# Patient Record
Sex: Male | Born: 1990 | Race: White | Hispanic: No | Marital: Single | Smoking: Current every day smoker
Health system: Southern US, Community
[De-identification: ages and names within clinical notes are randomized; demographics above are authoritative.]

## PROBLEM LIST (undated history)

## (undated) DIAGNOSIS — S6291XA Unspecified fracture of right wrist and hand, initial encounter for closed fracture: Secondary | ICD-10-CM

## (undated) DIAGNOSIS — F329 Major depressive disorder, single episode, unspecified: Secondary | ICD-10-CM

## (undated) DIAGNOSIS — F419 Anxiety disorder, unspecified: Secondary | ICD-10-CM

## (undated) DIAGNOSIS — F32A Depression, unspecified: Secondary | ICD-10-CM

## (undated) HISTORY — PX: KNEE SURGERY: SHX244

## (undated) HISTORY — PX: FOREIGN BODY REMOVAL: SHX962

## (undated) HISTORY — PX: TONSILLECTOMY: SUR1361

---
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
## 2012-11-24 ENCOUNTER — Encounter (HOSPITAL_COMMUNITY): Payer: Self-pay | Admitting: *Deleted

## 2012-11-24 ENCOUNTER — Emergency Department (INDEPENDENT_AMBULATORY_CARE_PROVIDER_SITE_OTHER): Payer: 59

## 2012-11-24 ENCOUNTER — Emergency Department (HOSPITAL_COMMUNITY)
Admission: EM | Admit: 2012-11-24 | Discharge: 2012-11-24 | Disposition: A | Discharge status: Home / Self Care | Payer: 59 | Source: Home / Self Care | Attending: Family Medicine | Admitting: Family Medicine

## 2012-11-24 DIAGNOSIS — S63509A Unspecified sprain of unspecified wrist, initial encounter: Secondary | ICD-10-CM

## 2012-11-24 DIAGNOSIS — S60221A Contusion of right hand, initial encounter: Secondary | ICD-10-CM

## 2012-11-24 DIAGNOSIS — S60229A Contusion of unspecified hand, initial encounter: Secondary | ICD-10-CM

## 2012-11-24 DIAGNOSIS — S63501A Unspecified sprain of right wrist, initial encounter: Secondary | ICD-10-CM

## 2012-11-24 HISTORY — DX: Unspecified fracture of right wrist and hand, initial encounter for closed fracture: S62.91XA

## 2012-11-24 MED ORDER — IBUPROFEN 600 MG PO TABS: 600.0000 mg | ORAL_TABLET | Freq: Three times a day (TID) | ORAL | Status: DC

## 2012-11-24 MED ORDER — TRAMADOL HCL 50 MG PO TABS: 50.0000 mg | ORAL_TABLET | Freq: Four times a day (QID) | ORAL | Status: DC | PRN

## 2012-11-24 MED ORDER — HYDROCODONE-ACETAMINOPHEN 5-500 MG PO TABS: 1.0000 | ORAL_TABLET | Freq: Three times a day (TID) | ORAL | Status: DC | PRN

## 2012-11-24 NOTE — ED Notes (Signed)
Ice bag applied right hand/wrist.
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
## 2014-02-02 ENCOUNTER — Encounter (HOSPITAL_COMMUNITY): Payer: Self-pay | Admitting: Emergency Medicine

## 2014-02-02 ENCOUNTER — Emergency Department (HOSPITAL_COMMUNITY)
Admission: EM | Admit: 2014-02-02 | Discharge: 2014-02-02 | Disposition: A | Discharge status: Home / Self Care | Payer: Managed Care, Other (non HMO) | Attending: Emergency Medicine | Admitting: Emergency Medicine

## 2014-02-02 DIAGNOSIS — F39 Unspecified mood [affective] disorder: Secondary | ICD-10-CM | POA: Insufficient documentation

## 2014-02-02 DIAGNOSIS — F1120 Opioid dependence, uncomplicated: Secondary | ICD-10-CM

## 2014-02-02 DIAGNOSIS — R000 Tachycardia, unspecified: Secondary | ICD-10-CM | POA: Insufficient documentation

## 2014-02-02 DIAGNOSIS — R630 Anorexia: Secondary | ICD-10-CM | POA: Insufficient documentation

## 2014-02-02 DIAGNOSIS — R110 Nausea: Secondary | ICD-10-CM | POA: Insufficient documentation

## 2014-02-02 DIAGNOSIS — F17200 Nicotine dependence, unspecified, uncomplicated: Secondary | ICD-10-CM | POA: Insufficient documentation

## 2014-02-02 DIAGNOSIS — R61 Generalized hyperhidrosis: Secondary | ICD-10-CM | POA: Insufficient documentation

## 2014-02-02 DIAGNOSIS — Z8781 Personal history of (healed) traumatic fracture: Secondary | ICD-10-CM | POA: Insufficient documentation

## 2014-02-02 DIAGNOSIS — R6883 Chills (without fever): Secondary | ICD-10-CM | POA: Insufficient documentation

## 2014-02-02 DIAGNOSIS — F1010 Alcohol abuse, uncomplicated: Secondary | ICD-10-CM | POA: Insufficient documentation

## 2014-02-02 HISTORY — DX: Anxiety disorder, unspecified: F41.9

## 2014-02-02 HISTORY — DX: Depression, unspecified: F32.A

## 2014-02-02 HISTORY — DX: Major depressive disorder, single episode, unspecified: F32.9

## 2014-02-02 LAB — COMPREHENSIVE METABOLIC PANEL
ALK PHOS: 90 U/L (ref 39–117)
ALT: 12 U/L (ref 0–53)
AST: 17 U/L (ref 0–37)
Albumin: 4.8 g/dL (ref 3.5–5.2)
BILIRUBIN TOTAL: 1 mg/dL (ref 0.3–1.2)
BUN: 7 mg/dL (ref 6–23)
CHLORIDE: 102 meq/L (ref 96–112)
CO2: 29 meq/L (ref 19–32)
CREATININE: 0.99 mg/dL (ref 0.50–1.35)
Calcium: 10.3 mg/dL (ref 8.4–10.5)
GFR calc Af Amer: >90 mL/min (ref >90)
GLUCOSE: 121 mg/dL — AB (ref 70–99)
POTASSIUM: 4.2 meq/L (ref 3.7–5.3)
Sodium: 143 mEq/L (ref 137–147)
Total Protein: 7.6 g/dL (ref 6.0–8.3)

## 2014-02-02 LAB — CBC WITH DIFFERENTIAL/PLATELET
Basophils Absolute: 0 10*3/uL (ref 0.0–0.1)
Basophils Relative: 0 % (ref 0–1)
Eosinophils Absolute: 0 10*3/uL (ref 0.0–0.7)
Eosinophils Relative: 1 % (ref 0–5)
HEMATOCRIT: 45.5 % (ref 39.0–52.0)
HEMOGLOBIN: 16.8 g/dL (ref 13.0–17.0)
LYMPHS ABS: 1.4 10*3/uL (ref 0.7–4.0)
LYMPHS PCT: 23 % (ref 12–46)
MCH: 33 pg (ref 26.0–34.0)
MCHC: 36.9 g/dL — AB (ref 30.0–36.0)
MCV: 89.4 fL (ref 78.0–100.0)
MONO ABS: 0.4 10*3/uL (ref 0.1–1.0)
MONOS PCT: 6 % (ref 3–12)
NEUTROS ABS: 4.5 10*3/uL (ref 1.7–7.7)
Neutrophils Relative %: 70 % (ref 43–77)
Platelets: 204 10*3/uL (ref 150–400)
RBC: 5.09 MIL/uL (ref 4.22–5.81)
RDW: 11.9 % (ref 11.5–15.5)
WBC: 6.3 10*3/uL (ref 4.0–10.5)

## 2014-02-02 LAB — SALICYLATE LEVEL

## 2014-02-02 LAB — RAPID URINE DRUG SCREEN, HOSP PERFORMED
Amphetamines: NOT DETECTED
Barbiturates: NOT DETECTED
Benzodiazepines: NOT DETECTED
Cocaine: POSITIVE — AB
Opiates: POSITIVE — AB
Tetrahydrocannabinol: NOT DETECTED

## 2014-02-02 LAB — ACETAMINOPHEN LEVEL

## 2014-02-02 LAB — ETHANOL

## 2014-02-02 NOTE — Discharge Instructions (Signed)
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 Here are some important things to consider:  There are different types of treatment to choose from.  Some programs are live-in (residential) while others are not (outpatient). Sometimes a combination is offered.  No single type of program is right for everyone.  Most treatment programs involve a combination of education, counseling, and a 12-step, spiritually-based approach.  There are non-spiritually based programs (not 12-step).  Some treatment programs are government sponsored. They are geared for patients without private insurance.  Treatment programs can vary in many respects such as:  Cost and types of insurance accepted.  Types of on-site medical services offered.  Length of stay, setting, and size.  Overall philosophy of treatment. A person may need specialized treatment or have needs not addressed by all programs. For example, adolescents need treatment appropriate for their age. Other people have secondary disorders that must be managed as well. Secondary conditions can include mental illness, such as depression or diabetes. Often, a period of detoxification from alcohol or drugs is needed. This requires medical supervision and not all programs offer this. THINGS TO CONSIDER WHEN SELECTING A TREATMENT PROGRAM   Is the program certified by the appropriate government agency? Even private programs must be certified and employ certified professionals.  Does the program accept  your insurance? If not, can a payment plan be set up?  Is the facility clean, organized, and well run? Do they allow you to speak with graduates who can share their treatment experience with you? Can you tour the facility? Can you meet with staff?  Does the program meet the full range of individual needs?  Does the treatment program address sexual orientation and physical disabilities? Do they provide age, gender, and culturally appropriate treatment services?  Is treatment available in languages other than English?  Is long-term aftercare support or guidance encouraged and provided?  Is assessment of an individual's treatment plan ongoing to ensure it meets changing needs?  Does the program use strategies to encourage reluctant patients to remain in treatment long enough to increase the likelihood of success?  Does the program offer counseling (individual or group) and other behavioral therapies?  Does the program offer medicine as part of the treatment regimen, if needed?  Is there ongoing monitoring of possible relapse? Is there a defined relapse prevention program? Are services or referrals offered to family members to ensure they understand addiction and the recovery process? This would help them support the recovering individual.  Are 12-step meetings held at the center or is transport available for patients to attend outside meetings? In countries outside of the [place]. and [place], Magazine features editor for contact information for services in your area. Document Released: 10/05/2005 Document Revised: 01/29/2012 Document Reviewed: 04/16/2008 [facility] Patient Information 2014 [place], [place].   Emergency Department Resource Guide 1) Find a Doctor and Pay Out of Pocket Although you won't have to find out who is covered by your insurance plan, it is a good idea to ask around and get recommendations. You will then need to call the office and see if  the doctor you have chosen will accept  you as a new patient and what types of options they offer for patients who are self-pay. Some doctors offer discounts or will set up payment plans for their patients who do not have insurance, but you will need to ask so you aren't surprised when you get to your appointment.  2) Contact Your Local Health Department Not all health departments have doctors that can see patients for sick visits, but many do, so it is worth a call to see if yours does. If you don't know where your local health department is, you can check in your phone book. The CDC also has a tool to help you locate your state's health department, and many state websites also have listings of all of their local health departments.  3) Find a Walk-in Clinic If your illness is not likely to be very severe or complicated, you may want to try a walk in clinic. These are popping up all over the country in pharmacies, drugstores, and shopping centers. They're usually staffed by nurse practitioners or physician assistants that have been trained to treat common illnesses and complaints. They're usually fairly quick and inexpensive. However, if you have serious medical issues or chronic medical problems, these are probably not your best option.  No Primary Care Doctor: - Call Health Connect at  [phone] - they can help you locate a primary care doctor that  accepts your insurance, provides certain services, etc. - Physician Referral Service- [phone]  Chronic Pain Problems: Organization         Address  Phone   Notes  [facility]  [phone] Patients need to be referred by their primary care doctor.   Medication Assistance: Organization         Address  Phone   Notes  [facility] Medication [facility] [address] [place] [place], [place] [address] [phone] --Must be a resident of [facility] -- Must have NO insurance coverage whatsoever (no Medicaid/ Medicare, etc.) -- The pt. MUST  have a primary care doctor that directs their care regularly and follows them in the community   MedAssist  [phone]   [employer]  [phone]    Agencies that provide inexpensive medical care: Organization         Address  Phone   Notes  [facility]  [phone]   [facility]    [phone]   [facility] [address] [place], [place] [address] [phone]   [facility] [address] [place] [place] [phone]   Planned Parenthood    [phone]   [facility]    [phone]   Community Health and [facility]  [address], [place] Phone:  [phone], Fax:  [phone] Hours of Operation:  9 am - 6 pm, M-F.  Also accepts Medicaid/Medicare and self-pay.  [facility]  [address],  Phone: [phone], Fax: [phone]. Hours of Operation:  8:30 am - 5:30 pm, M-F.  Also accepts Medicaid and self-pay.  [facility] [address], [place] Phone: [phone]   [facility] [address] Natasha Bence [place], [place] [phone], Ext. [phone] Mondays & Thursdays: 7-9 AM.  First 15 patients are seen on a first come, first serve basis.    Medicaid-accepting [facility] Providers:  Organization         Address  Phone   Notes  [facility] [address], [place] [phone] Also accepts self-pay patients.  [facility] [address], [place]  [phone]   [place], [address], [place] [phone]   [facility] [address], [place] [phone]   Lucianne Lei [address], [place]   [phone] Only accepts [place] Access [place] patients after they have their name applied to their card.   Self-Pay (no insurance) in [facility]:  Organization         Address  Phone   Notes  Sickle Cell Patients, [facility] [place] [phone]   [facility] [place] [phone]   [facility] [place]  [place], [place], [place] [phone]   [facility]/Dr. Osei-Bonsu  [address], [place] or [address], [place] [phone] Phone number for both [place] and [place] locations is the same.  Urgent Medical and [facility] [address] [place] [phone]   [facility] [address] [place] or [address] [phone]   [facility] [address] [place] [phone], phone; [phone], fax Sees patients 1st and 3rd Saturday of every month.  Must not qualify for public or private insurance (i.e. Medicaid, Medicare, [place] Health Choice, Veterans' Benefits)  Household income should be no more than 200% of the poverty level The clinic cannot treat you if you are pregnant or think you are pregnant  Sexually transmitted diseases are not treated at the clinic.    Dental Care: Organization         Address  Phone  Notes  [facility] Department of [facility] [place] [phone] Accepts children up to age 2 who are enrolled in [place] or [place]; pregnant women with a Medicaid card; and children who have applied for Medicaid or [place] Health Choice, but were declined, whose parents can pay a reduced fee at time of service.  [facility] Department of [facility]  [address], [place] [phone] Accepts children up to age 5 who are enrolled in [place] or [place]; pregnant women with a Medicaid card; and children who have applied for Medicaid or  Health Choice, but were declined, whose parents can  pay a reduced fee at time of service.  [facility] Adult [facility] Access PROGRAM  [place] [phone] Patients are seen by appointment only. Walk-ins are not accepted. [place] will see patients 27 years of age and older. Monday - Tuesday (8am-5pm) Most Wednesdays (8:30-5pm) $30 per visit, cash only  [facility] Adult Dental Access PROGRAM  [address], [facility] [phone] Patients are seen by appointment only. Walk-ins are not accepted. [place] will see patients 32 years of age and older. One Wednesday Evening (Monthly: Volunteer Based).  $30 per visit, cash only  [place]  [phone] for adults; Children under age 53, call Graduate Pediatric Dentistry at [phone]. Children aged 3-14, please call [phone] to request a pediatric application.  Dental services  are provided in all areas of dental care including fillings, crowns and bridges, complete and partial dentures, implants, gum treatment, root canals, and extractions. Preventive care is also provided. Treatment is provided to both adults and children. Patients are selected via a lottery and there is often a waiting list.   [facility] [address], [place]  [phone] [email or web address]   [facility] [address] [place], [place] [phone], Ext. [phone] Second and Fourth Thursday of each month, opens at 6:30 AM; Clinic ends at 9 AM.  Patients are seen on a first-come first-served basis, and a limited number are seen during each clinic.   [facility]  [address] Hillard Danker [place], [place] [phone]   Eligibility Requirements You must have lived in [place], [place], or [place] counties for at least the last three months.   You cannot be eligible for state or federal sponsored [employer], including [employer], [place], or [employer].   You generally cannot be eligible for healthcare  insurance through your employer.    How to apply: Eligibility screenings are held every Tuesday and Wednesday afternoon from 1:00 pm until 4:00 pm. You do not need an appointment for the interview!  [facility] [address], [place], [place]   [place]  [place] Department  [place]  [phone]    Behavioral Health Resources in the Community: Intensive Outpatient Programs Organization         Address  Phone  Notes  [facility] [place]. [address] [place], [place] [phone]   [facility] Outpatient [address], [place], [place]   ADS: Alcohol [facility] Drug S[facility]cs [address] [place], [place]   [place] [address],  [place], [place] or [phone]   Substance Abuse Resources Organization         Address  Phone  Notes  [facility] Drug Services  [phone]   [place]  [phone]   [facility] [place]   Chinita Pester  [phone]   Residential & Outpatient Substance Abuse Program  [phone]   Psychological Services Organization         Address  Phone  Notes  [facility] [place]  [place]  [phone]   [place] [address] [place] or [phone]    Mobile Crisis Teams Organization         Address  Phone  Notes  [facility], Mobile Crisis Care Unit  [phone]   [facility]  [address] [place], [place]   [place] [address], [place] [phone]    Self-Help/Support Groups Organization         Address  Phone             Notes  [place]. of [place] - variety of support groups  [place] Call for more information  Narcotics Anonymous (NA),  [facility] [address], [employer] [place]  2 meetings at this location   Special educational needs teacher         Address  Phone  Notes  [facility] Residential Treatment [place],    [place]  [place]  [address], [place], [place], [place]  [facility] [address], [place] [phone] Admissions: 8am-3pm M-F  [facility] [address]    [place], [place] [phone]   [facility] [address], [place], [place] [phone]   [facility] [address]  [place], [place] [phone]   [facility] - Intensive Outpatient [address], [place], [place] [phone]   [facility] ([facility] [address]  [place], [place] [phone] or [phone]   [facility] ([facility]) [address] [place], [place] [phone] Accepts Medicaid  [facility] [address]  [place] [phone] Substance Abuse/Addiction Treatment   [facility] Behavioral Health Resources Organization         Address  Phone  Notes  [facility]  [phone]) [phone]   Angie FavaJulie Brannon, PhD [address] [place], [place]   [phone] or [phone]) [phone]   [facility]   [address] [place], [place] [phone]) [phone]   [facility] [address], [place], [place] [phone]) [phone] Insurance/Medicaid/sponsorship through [facility]  [facility] Families [address]                                    [place], [place] [phone]) [phone] Therapy/tele-psych/case  [facility] [address] [place].   [place], [place] [phone]    Dr. Lolly MustacheArfeen  [phone](336) [phone]   [facility] [place]  [employer] [facility] 1) [address], [place] 2) [address], [place] 3)  [address], [place] [phone]  [phone]) [phone]     [facility] Child Abuse Hotline [phone]) [phone] or [phone](336) [phone] (After Hours)

## 2014-02-02 NOTE — ED Provider Notes (Signed)
CSN: [id number]     Arrival date & time 02/02/14  1234 History   First MD Initiated Contact with Patient 02/02/14 1241 This chart was scribed for non-physician practitioner Raymon MuttonMarissa Cordie Beazley, PA-C working with Lyanne CoKevin M Campos, MD by Valera CastleSteven Perry, ED scribe. This patient was seen in room WTR5/WTR5 and the patient's care was started at 12:46 PM.    Chief Complaint  Patient presents with  . Medical Clearance   (Consider location/radiation/quality/duration/timing/severity/associated sxs/prior Treatment) The history is provided by the patient. No language interpreter was used.   HPI Comments: Mark Mata is a 23 y.o. male who presents to the Emergency Department for detox from pain pills, stating he is having withdrawals. He has been taking Roxicodone and Opana. He normally takes 80-100 mg per day. His last dose was 2 hours ago, 40 mg of Opana. He reports cocaine use, small amounts, last used 2 days ago. He also reports heroine use, small amounts, last used yesterday. He drinks EtOH on the weekends, 4-5 beers. He denies marijuana use.   He denies hallucinations. He denies SI, HI. He reports worsening depression, onset last week. He reports difficulty sleeping at night. He hast eaten once in the last 2 days. He denies having been in detox before. He reports associated nausea, sweats, chills. He states he usually gets back pain with his urges to use the drugs. He denies any other associated symptoms. He denied chest pain, shortness of breath, difficulty breathing, dizziness,headaches, blurred vision, visual changes.   PCP - Josue HectorNYLAND,LEONARD ROBERT, MD  Past Medical History  Diagnosis Date  . Hand fracture, right     x2  . Anxiety and depression    Past Surgical History  Procedure Laterality Date  . Tonsillectomy    . Foreign body removal      "stick from leg"  . Knee surgery     History reviewed. No pertinent family history. History  Substance Use Topics  . Smoking status: Current Every Day  Smoker -- 0.50 packs/day    Types: Cigarettes  . Smokeless tobacco: Not on file  . Alcohol Use: Yes     Comment: social    Review of Systems  Constitutional: Positive for chills, diaphoresis and appetite change (decreased).  Respiratory: Negative for chest tightness and shortness of breath.   Cardiovascular: Negative for chest pain.  Gastrointestinal: Positive for nausea. Negative for vomiting, abdominal pain and blood in stool.  Genitourinary: Negative for hematuria.  Musculoskeletal: Negative for neck pain.  Skin: Negative for wound.  Neurological: Negative for dizziness and weakness.  Psychiatric/Behavioral: Positive for dysphoric mood. Negative for suicidal ideas, hallucinations and self-injury.  All other systems reviewed and are negative.   Allergies  Review of patient's allergies indicates no known allergies.  Home Medications   No current outpatient prescriptions on file. BP 159/89  Pulse 106  Temp(Src) 98 F (36.7 C) (Oral)  Resp 18  SpO2 98%  Physical Exam  Nursing note and vitals reviewed. Constitutional: He is oriented to person, place, and time. He appears well-developed and well-nourished. No distress.  HENT:  Head: Normocephalic and atraumatic.  Mouth/Throat: Oropharynx is clear and moist. No oropharyngeal exudate.  Eyes: Conjunctivae and EOM are normal. Pupils are equal, round, and reactive to light. Right eye exhibits no discharge. Left eye exhibits no discharge.  Negative nystagmus Visual fields grossly intact  Neck: Normal range of motion. Neck supple. No tracheal deviation present.  Negative neck stiffness Negative nuchal rigidity Negative cervical lymphadenopathy  Cardiovascular: Regular rhythm and  normal heart sounds.  Tachycardia present.  Exam reveals no friction rub.   No murmur heard. Pulses:      Radial pulses are 2+ on the right side, and 2+ on the left side.       Dorsalis pedis pulses are 2+ on the right side, and 2+ on the left side.   Mild tachycardia upon auscultation  Pulmonary/Chest: Effort normal and breath sounds normal. No respiratory distress. He has no wheezes. He has no rales.  Abdominal: Soft. There is no tenderness. There is no guarding.  Musculoskeletal: Normal range of motion. He exhibits no tenderness.  Full ROM to upper and lower extremities without difficulty noted, negative ataxia noted.  Lymphadenopathy:    He has no cervical adenopathy.  Neurological: He is alert and oriented to person, place, and time. No cranial nerve deficit. He exhibits normal muscle tone. Coordination normal.  Cranial nerves III-XII grossly intact Strength 5+/5+ to upper and lower extremities bilaterally with resistance applied, equal distribution noted Sensation intact Equal grip strength Negative shakes or tremors  Skin: Skin is warm. He is diaphoretic.  Negative track marks noted  Psychiatric: He has a normal mood and affect. His behavior is normal.    ED Course  Procedures (including critical care time)  DIAGNOSTIC STUDIES: Oxygen Saturation is 98% on room air, normal by my interpretation.    COORDINATION OF CARE: 12:50 PM-Discussed treatment plan which includes EKG, blood work, and UA with pt at bedside and pt agreed to plan.   2:08 PM This provider was made aware that the patient does not want inpatient care. Patient would like to detox as outpatient. Denied chest pain, shortness of breath, difficulty breathing. Discussed with patient the dangers of narcotic abuse as well as alcohol withdrawals-discussed with patient that alcohol withdrawal can lead to death-patient understood and continued to want to be discharged for outpatient detox. This provider recommended inpatient detox and to go through the process in the psych ED to be placed -patient continued to decline, wanted outpatient and to be discharged, patient did not want to go through the process. Denied chest pain, shortness of breath, difficulty breathing.    Results for orders placed during the hospital encounter of 02/02/14  CBC WITH DIFFERENTIAL      Result Value Ref Range   WBC 6.3  4.0 - 10.5 K/uL   RBC 5.09  4.22 - 5.81 MIL/uL   Hemoglobin 16.8  13.0 - 17.0 g/dL   HCT 16.1  09.6 - 04.5 %   MCV 89.4  78.0 - 100.0 fL   MCH 33.0  26.0 - 34.0 pg   MCHC 36.9 (*) 30.0 - 36.0 g/dL   RDW 40.9  81.1 - 91.4 %   Platelets 204  150 - 400 K/uL   Neutrophils Relative % 70  43 - 77 %   Neutro Abs 4.5  1.7 - 7.7 K/uL   Lymphocytes Relative 23  12 - 46 %   Lymphs Abs 1.4  0.7 - 4.0 K/uL   Monocytes Relative 6  3 - 12 %   Monocytes Absolute 0.4  0.1 - 1.0 K/uL   Eosinophils Relative 1  0 - 5 %   Eosinophils Absolute 0.0  0.0 - 0.7 K/uL   Basophils Relative 0  0 - 1 %   Basophils Absolute 0.0  0.0 - 0.1 K/uL  COMPREHENSIVE METABOLIC PANEL      Result Value Ref Range   Sodium 143  137 - 147 mEq/L   Potassium  4.2  3.7 - 5.3 mEq/L   Chloride 102  96 - 112 mEq/L   CO2 29  19 - 32 mEq/L   Glucose, Bld 121 (*) 70 - 99 mg/dL   BUN 7  6 - 23 mg/dL   Creatinine, Ser 1.61  0.50 - 1.35 mg/dL   Calcium 09.6  8.4 - 04.5 mg/dL   Total Protein 7.6  6.0 - 8.3 g/dL   Albumin 4.8  3.5 - 5.2 g/dL   AST 17  0 - 37 U/L   ALT 12  0 - 53 U/L   Alkaline Phosphatase 90  39 - 117 U/L   Total Bilirubin 1.0  0.3 - 1.2 mg/dL   GFR calc non Af Amer >90  >90 mL/min   GFR calc Af Amer >90  >90 mL/min  ETHANOL      Result Value Ref Range   Alcohol, Ethyl (B) <11  0 - 11 mg/dL  SALICYLATE LEVEL      Result Value Ref Range   Salicylate Lvl <2.0 (*) 2.8 - 20.0 mg/dL  ACETAMINOPHEN LEVEL      Result Value Ref Range   Acetaminophen (Tylenol), Serum <15.0  10 - 30 ug/mL  URINE RAPID DRUG SCREEN (HOSP PERFORMED)      Result Value Ref Range   Opiates POSITIVE (*) NONE DETECTED   Cocaine POSITIVE (*) NONE DETECTED   Benzodiazepines NONE DETECTED  NONE DETECTED   Amphetamines NONE DETECTED  NONE DETECTED   Tetrahydrocannabinol NONE DETECTED  NONE DETECTED    Barbiturates NONE DETECTED  NONE DETECTED   No results found.   EKG Interpretation None     Medications - No data to display MDM   Final diagnoses:  Opiate dependence  Alcohol abuse   Filed Vitals:   02/02/14 1256  BP: 159/89  Pulse: 106  Temp: 98 F (36.7 C)  TempSrc: Oral  Resp: 18  SpO2: 98%    I personally performed the services described in this documentation, which was scribed in my presence. The recorded information has been reviewed and is accurate.  Patient presenting to the ED with request for detox from narcotics that he has been using for the past 8 months - stated that he has been using Roxicet and Opana. Stated that he has been using anywhere from 80-100 mg daily. Stated that his last use of meds was 24 hours ago of Opana at 40 mg. Stated that he also uses cocaine, reported a "small amount" 2 days ago. Stated that he has been using heroin as well, reported a "small amount" stated that he used heroin yesterday. Reported that he snorts the heroin and cocaine. Reported that he drinks alcohol on the weekends anywhere from 4-5 beers. Stated that he has been having increased depression for the past couple of weeks. Stated that he has been experiencing shakes, sweats, chills the past couple of days.  Alert and oriented. GCS 15. Heart rate noted to be mildly tachycardic with normal rhythm. Radial and DP pulses 2+ bilaterally. Cap refill less than 3 seconds. Patient appears mildly diaphoretic with sweaty palms. Bowel sounds normal active in all 4 quadrants negative pain upon palpation to the abdomen. Full range of motion to upper and lower extremities bilaterally without difficulty noted. Negative neck stiffness, negative nuchal rigidity, negative cervical lymphadenopathy. Negative nystagmus, visual fields grossly intact. Negative shakes or tremors identified. Good eye contact. CBC negative elevation white blood cell count-negative left shift or leukocytosis. CMP negative findings.  Ethanol level negative  elevation. Salicylate level negative elevation. Acetaminophen level negative elevation. Urine drug screen positive for opiates and cocaine. Patient stable, afebrile. Patient presenting to the ED for request of detox from narcotics and alcohol. Labs unremarkable. Patient does not want to stay as inpatient for detox, patient is requesting to detox as an outpatient. Discussed with patient that this provider recommended to go through the process of psych and to have inpatient detox - patient refused. Discussed with patient the dangers of withdrawal specifically from alcohol that this can be fatal. Patient continued to want to be discharged and understood. Resource guide administered for detox as outpatient. Discussed with patient to closely monitor symptoms and if symptoms are to worsen or change to report back to the ED - strict return instructions given.  Patient agreed to plan of care, understood, all questions answered.   Raymon Mutton, PA-C 02/02/14 2158  Raymon Mutton, PA-C 02/03/14 0125

## 2014-02-02 NOTE — ED Notes (Signed)
Pt states he needs detox from pain meds and heroine.  Denies cocaine and etoh addiction.

## 2014-02-02 NOTE — ED Notes (Signed)
PA at bedside.

## 2014-02-03 MED ORDER — LORAZEPAM 0.5 MG PO TABS: 0.5000 mg | ORAL_TABLET | Freq: Four times a day (QID) | ORAL | Status: AC | PRN

## 2014-02-03 MED ORDER — ONDANSETRON HCL 4 MG PO TABS: 4.0000 mg | ORAL_TABLET | Freq: Four times a day (QID) | ORAL | Status: AC

## 2014-02-03 MED ORDER — LORAZEPAM 0.5 MG PO TABS: 0.5000 mg | ORAL_TABLET | Freq: Four times a day (QID) | ORAL | Status: DC | PRN

## 2014-02-03 NOTE — ED Provider Notes (Signed)
Medical screening examination/treatment/procedure(s) were performed by non-physician practitioner and as supervising physician I was immediately available for consultation/collaboration.   EKG Interpretation None        Marikay Roads M Trystan Eads, MD 02/03/14 1638 

## 2014-02-03 NOTE — ED Provider Notes (Signed)
02/03/2014 11:23 AM This provider spoke with patient via telephone - verified by name and DOB - to see how patient was doing, patient reported that he was doing okay. Reported that he is going to be calling outpatient detox centers today. Reported that he has been having increase in nausea. Denied chest pain, shortness of breath, difficulty breathing. This provider reported will prescribe some ativan and zofran. Discussed the dangers and fatality of alcohol withdrawls, narcotic abuse, and medication misuse. Discussed importance of detox and following up. Obtained information for [facility]'s [facility] [place] [phone]. Recommended patient to get in contact with his primary care provider and discussed with patient to see his primary care provider as soon as possible. Discussed with patient to closely monitor symptoms and if symptoms are to worsen or change to report back to the ED - strict return instructions given.  3:11 PM This provider spoke with patient's pharmacy - spoke with Rocky LinkKen and placed orders for Ativan and zofran.   Mark MuttonMarissa Draken Farrior, Mark Mata 02/03/14 2322  Mark MuttonMarissa Sajjad Honea, Mark Mata 02/05/14 1042

## 2014-02-07 NOTE — ED Provider Notes (Signed)
Medical screening examination/treatment/procedure(s) were performed by non-physician practitioner and as supervising physician I was immediately available for consultation/collaboration.   EKG Interpretation   Date/Time:  Monday February 02 2014 12:59:41 EDT Ventricular Rate:  116 PR Interval:  117 QRS Duration: 83 QT Interval:  314 QTC Calculation: 436 R Axis:   102 Text Interpretation:  Sinus tachycardia Right atrial enlargement  Borderline right axis deviation Borderline repolarization abnormality  Baseline wander in lead(s) V3 ED PHYSICIAN INTERPRETATION AVAILABLE IN  [facility] Confirmed by TEST, Record ([id number]) on 02/04/2014 7:21:20 AM        Lyanne CoKevin M Jelitza Manninen, MD 02/07/14 [phone]

## 2016-08-01 ENCOUNTER — Encounter (HOSPITAL_COMMUNITY): Payer: Self-pay | Admitting: Emergency Medicine

## 2016-08-01 ENCOUNTER — Emergency Department (HOSPITAL_COMMUNITY): Payer: Managed Care, Other (non HMO)

## 2016-08-01 ENCOUNTER — Emergency Department (HOSPITAL_COMMUNITY)
Admission: EM | Admit: 2016-08-01 | Discharge: 2016-08-01 | Disposition: A | Discharge status: Home / Self Care | Payer: Managed Care, Other (non HMO) | Attending: Emergency Medicine | Admitting: Emergency Medicine

## 2016-08-01 DIAGNOSIS — M533 Sacrococcygeal disorders, not elsewhere classified: Secondary | ICD-10-CM | POA: Diagnosis not present

## 2016-08-01 DIAGNOSIS — Y92410 Unspecified street and highway as the place of occurrence of the external cause: Secondary | ICD-10-CM | POA: Insufficient documentation

## 2016-08-01 DIAGNOSIS — M25512 Pain in left shoulder: Secondary | ICD-10-CM | POA: Insufficient documentation

## 2016-08-01 DIAGNOSIS — S01511A Laceration without foreign body of lip, initial encounter: Secondary | ICD-10-CM | POA: Insufficient documentation

## 2016-08-01 DIAGNOSIS — Y939 Activity, unspecified: Secondary | ICD-10-CM | POA: Diagnosis not present

## 2016-08-01 DIAGNOSIS — F17210 Nicotine dependence, cigarettes, uncomplicated: Secondary | ICD-10-CM | POA: Diagnosis not present

## 2016-08-01 DIAGNOSIS — S0993XA Unspecified injury of face, initial encounter: Secondary | ICD-10-CM | POA: Diagnosis present

## 2016-08-01 DIAGNOSIS — Y999 Unspecified external cause status: Secondary | ICD-10-CM | POA: Insufficient documentation

## 2016-08-01 LAB — BASIC METABOLIC PANEL
ANION GAP: 6 (ref 5–15)
BUN: 14 mg/dL (ref 6–20)
CALCIUM: 9.8 mg/dL (ref 8.9–10.3)
CHLORIDE: 106 mmol/L (ref 101–111)
CO2: 28 mmol/L (ref 22–32)
CREATININE: 1.11 mg/dL (ref 0.61–1.24)
GFR calc Af Amer: >60 mL/min (ref >60)
GFR calc non Af Amer: >60 mL/min (ref >60)
Glucose, Bld: 106 mg/dL — ABNORMAL HIGH (ref 65–99)
Potassium: 3.9 mmol/L (ref 3.5–5.1)
SODIUM: 140 mmol/L (ref 135–145)

## 2016-08-01 LAB — CBC WITH DIFFERENTIAL/PLATELET
BASOS ABS: 0 10*3/uL (ref 0.0–0.1)
BASOS PCT: 0 %
EOS ABS: 0 10*3/uL (ref 0.0–0.7)
Eosinophils Relative: 0 %
HEMATOCRIT: 42.5 % (ref 39.0–52.0)
HEMOGLOBIN: 14.8 g/dL (ref 13.0–17.0)
Lymphocytes Relative: 15 %
Lymphs Abs: 1.4 10*3/uL (ref 0.7–4.0)
MCH: 31.6 pg (ref 26.0–34.0)
MCHC: 34.8 g/dL (ref 30.0–36.0)
MCV: 90.8 fL (ref 78.0–100.0)
Monocytes Absolute: 0.6 10*3/uL (ref 0.1–1.0)
Monocytes Relative: 6 %
NEUTROS ABS: 7.1 10*3/uL (ref 1.7–7.7)
NEUTROS PCT: 79 %
Platelets: 206 10*3/uL (ref 150–400)
RBC: 4.68 MIL/uL (ref 4.22–5.81)
RDW: 12.2 % (ref 11.5–15.5)
WBC: 9.2 10*3/uL (ref 4.0–10.5)

## 2016-08-01 LAB — ETHANOL: Alcohol, Ethyl (B): <5 mg/dL (ref <5)

## 2016-08-01 MED ORDER — LIDOCAINE HCL 2 % IJ SOLN
5.0000 mL | Freq: Once | INTRAMUSCULAR | Status: DC
  Filled 2016-08-01: qty 20
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
## 2017-10-07 MED ORDER — TRAZODONE HCL 100 MG PO TABS
100.00 mg | ORAL_TABLET | ORAL | Status: DC
Start: ? — End: 2017-10-07

## 2017-10-07 MED ORDER — NICOTINE 14 MG/24HR TD PT24
1.00 | MEDICATED_PATCH | TRANSDERMAL | Status: DC
Start: 2017-10-08 — End: 2017-10-07

## 2017-10-07 MED ORDER — GABAPENTIN 300 MG PO CAPS
300.00 mg | ORAL_CAPSULE | ORAL | Status: DC
Start: 2017-10-07 — End: 2017-10-07

## 2017-10-07 MED ORDER — NICOTINE POLACRILEX 2 MG MT GUM
2.00 mg | CHEWING_GUM | OROMUCOSAL | Status: DC
Start: ? — End: 2017-10-07

## 2017-10-07 MED ORDER — HYDROXYZINE PAMOATE 50 MG PO CAPS
50.00 mg | ORAL_CAPSULE | ORAL | Status: DC
Start: ? — End: 2017-10-07

## 2017-10-07 MED ORDER — QUETIAPINE FUMARATE 100 MG PO TABS
100.00 mg | ORAL_TABLET | ORAL | Status: DC
Start: 2017-10-07 — End: 2017-10-07

## 2017-10-07 MED ORDER — AMANTADINE HCL 100 MG PO CAPS
100.00 mg | ORAL_CAPSULE | ORAL | Status: DC
Start: 2017-10-07 — End: 2017-10-07

## 2017-10-07 MED ORDER — METHOCARBAMOL 500 MG PO TABS
500.00 mg | ORAL_TABLET | ORAL | Status: DC
Start: 2017-10-07 — End: 2017-10-07

## 2017-10-07 MED ORDER — GENERIC EXTERNAL MEDICATION
Status: DC
Start: ? — End: 2017-10-07
